# Patient Record
Sex: Female | Born: 1995 | Race: Black or African American | Hispanic: No | Marital: Single | Smoking: Former smoker
Health system: Southern US, Community
[De-identification: ages and names within clinical notes are randomized; demographics above are authoritative.]

## PROBLEM LIST (undated history)

## (undated) DIAGNOSIS — M419 Scoliosis, unspecified: Secondary | ICD-10-CM

## (undated) HISTORY — PX: BACK SURGERY: SHX140

---

## 2014-02-01 ENCOUNTER — Emergency Department (HOSPITAL_BASED_OUTPATIENT_CLINIC_OR_DEPARTMENT_OTHER)
Admission: EM | Admit: 2014-02-01 | Discharge: 2014-02-02 | Disposition: A | Discharge status: Home / Self Care | Payer: Self-pay | Attending: Emergency Medicine | Admitting: Emergency Medicine

## 2014-02-01 ENCOUNTER — Encounter (HOSPITAL_BASED_OUTPATIENT_CLINIC_OR_DEPARTMENT_OTHER): Payer: Self-pay | Admitting: *Deleted

## 2014-02-01 DIAGNOSIS — B3731 Acute candidiasis of vulva and vagina: Secondary | ICD-10-CM

## 2014-02-01 DIAGNOSIS — Z3202 Encounter for pregnancy test, result negative: Secondary | ICD-10-CM | POA: Insufficient documentation

## 2014-02-01 DIAGNOSIS — B373 Candidiasis of vulva and vagina: Secondary | ICD-10-CM | POA: Insufficient documentation

## 2014-02-01 DIAGNOSIS — N390 Urinary tract infection, site not specified: Secondary | ICD-10-CM | POA: Insufficient documentation

## 2014-02-01 DIAGNOSIS — Z720 Tobacco use: Secondary | ICD-10-CM | POA: Insufficient documentation

## 2014-02-01 HISTORY — DX: Scoliosis, unspecified: M41.9

## 2014-02-01 NOTE — ED Notes (Signed)
Pt c/o vaginal discharge x 2 weeks  

## 2014-02-02 LAB — URINALYSIS, ROUTINE W REFLEX MICROSCOPIC
Bilirubin Urine: NEGATIVE
Glucose, UA: NEGATIVE mg/dL
Ketones, ur: NEGATIVE mg/dL
NITRITE: NEGATIVE
PH: 5.5 (ref 5.0–8.0)
Protein, ur: 30 mg/dL — AB
Specific Gravity, Urine: 1.027 (ref 1.005–1.030)
Urobilinogen, UA: 0.2 mg/dL (ref 0.0–1.0)

## 2014-02-02 LAB — WET PREP, GENITAL
CLUE CELLS WET PREP: NONE SEEN
TRICH WET PREP: NONE SEEN

## 2014-02-02 LAB — PREGNANCY, URINE: Preg Test, Ur: NEGATIVE

## 2014-02-02 LAB — URINE MICROSCOPIC-ADD ON

## 2014-02-02 LAB — GC/CHLAMYDIA PROBE AMP (~~LOC~~) NOT AT ARMC
Chlamydia: POSITIVE — AB
Neisseria Gonorrhea: NEGATIVE

## 2014-02-02 MED ORDER — FLUCONAZOLE 50 MG PO TABS
150.0000 mg | ORAL_TABLET | Freq: Once | ORAL | Status: AC
  Administered 2014-02-02: 150 mg via ORAL

## 2014-02-02 MED ORDER — NITROFURANTOIN MONOHYD MACRO 100 MG PO CAPS
ORAL_CAPSULE | ORAL | Status: AC
  Filled 2014-02-02: qty 1

## 2014-02-02 MED ORDER — FLUCONAZOLE 100 MG PO TABS
ORAL_TABLET | ORAL | Status: AC
  Filled 2014-02-02: qty 1

## 2014-02-02 MED ORDER — NITROFURANTOIN MONOHYD MACRO 100 MG PO CAPS
100.0000 mg | ORAL_CAPSULE | Freq: Once | ORAL | Status: AC
  Administered 2014-02-02: 100 mg via ORAL

## 2014-02-02 MED ORDER — NITROFURANTOIN MONOHYD MACRO 100 MG PO CAPS: 100.0000 mg | ORAL_CAPSULE | Freq: Two times a day (BID) | ORAL | Status: DC

## 2014-02-02 NOTE — ED Provider Notes (Signed)
CSN: [id number]     Arrival date & time 02/01/14  2352 History   First MD Initiated Contact with Patient 02/02/14 0131     Chief Complaint  Patient presents with  . Vaginal Discharge     (Consider location/radiation/quality/duration/timing/severity/associated sxs/prior Treatment) HPI  This is a 19 year old female with a two-week history of burning with urination and pelvic discomfort with urination. Symptoms are moderate. The symptoms been associated with a "creamy" vaginal discharge. She also noted some mild vaginal bleeding this morning. She recently had a Depo shot and does not normally have bleeding with this. There is no specific exacerbating or mitigating factor. She denies fever, chills, nausea, vomiting or diarrhea.  Past Medical History  Diagnosis Date  . Scoliosis    Past Surgical History  Procedure Laterality Date  . Back surgery     History reviewed. No pertinent family history. History  Substance Use Topics  . Smoking status: Current Some Day Smoker  . Smokeless tobacco: Not on file  . Alcohol Use: No   OB History    No data available     Review of Systems  All other systems reviewed and are negative.   Allergies  Review of patient's allergies indicates no known allergies.  Home Medications   Prior to Admission medications   Not on File   BP 102/66 mmHg  Pulse 98  Temp(Src) 98.3 F (36.8 C) (Oral)  Resp 16  Ht 5\' 7"  (1.702 m)  Wt 118 lb (53.524 kg)  BMI 18.48 kg/m2  SpO2 100%   Physical Exam  General: Well-developed, well-nourished female in no acute distress; appearance consistent with age of record HENT: normocephalic; atraumatic Eyes: pupils equal, round and reactive to light; extraocular muscles intact Neck: supple Heart: regular rate and rhythm Lungs: clear to auscultation bilaterally Abdomen: soft; nondistended; nontender; no masses or hepatosplenomegaly; bowel sounds present GU: Normal external genitalia; gray vaginal discharge; no  cervical motion tenderness; no adnexal tenderness Extremities: No deformity; full range of motion; pulses normal Neurologic: Awake, alert and oriented; motor function intact in all extremities and symmetric; no facial droop Skin: Warm and dry Psychiatric: Normal mood and affect    ED Course  Procedures (including critical care time)   MDM   Nursing notes and vitals signs, including pulse oximetry, reviewed.  Summary of this visit's results, reviewed by myself:  Labs:  Results for orders placed or performed during the hospital encounter of 02/01/14 (from the past 24 hour(s))  Urinalysis, Routine w reflex microscopic     Status: Abnormal   Collection Time: 02/02/14 12:05 AM  Result Value Ref Range   Color, Urine YELLOW YELLOW   APPearance CLOUDY (A) CLEAR   Specific Gravity, Urine 1.027 1.005 - 1.030   pH 5.5 5.0 - 8.0   Glucose, UA NEGATIVE NEGATIVE mg/dL   Hgb urine dipstick LARGE (A) NEGATIVE   Bilirubin Urine NEGATIVE NEGATIVE   Ketones, ur NEGATIVE NEGATIVE mg/dL   Protein, ur 30 (A) NEGATIVE mg/dL   Urobilinogen, UA 0.2 0.0 - 1.0 mg/dL   Nitrite NEGATIVE NEGATIVE   Leukocytes, UA LARGE (A) NEGATIVE  Pregnancy, urine     Status: None   Collection Time: 02/02/14 12:05 AM  Result Value Ref Range   Preg Test, Ur NEGATIVE NEGATIVE  Urine microscopic-add on     Status: Abnormal   Collection Time: 02/02/14 12:05 AM  Result Value Ref Range   Squamous Epithelial / LPF FEW (A) RARE   WBC, UA 21-50 <3 WBC/hpf  RBC / HPF 11-20 <3 RBC/hpf   Bacteria, UA MANY (A) RARE   Urine-Other MUCOUS PRESENT   Wet prep, genital     Status: Abnormal   Collection Time: 02/02/14  1:43 AM  Result Value Ref Range   Yeast Wet Prep HPF POC MODERATE (A) NONE SEEN   Trich, Wet Prep NONE SEEN NONE SEEN   Clue Cells Wet Prep HPF POC NONE SEEN NONE SEEN   WBC, Wet Prep HPF POC MANY (A) NONE SEEN      Hanley Seamen, MD 02/02/14 [phone]

## 2014-02-04 LAB — URINE CULTURE: Colony Count: 85000

## 2014-02-06 ENCOUNTER — Telehealth (HOSPITAL_COMMUNITY): Payer: Self-pay

## 2014-02-06 NOTE — Telephone Encounter (Signed)
Post ED Visit - Positive Culture Follow-up  Culture report reviewed by antimicrobial stewardship pharmacist: []  Wes Dulaney, Pharm.D., BCPS []  Celedonio MiyamotoJeremy Frens, Pharm.D., BCPS [x]  Georgina PillionElizabeth Martin, Pharm.D., BCPS []  Piney PointMinh Pham, [address]Ph[address]D., BCPS, AAHIVP []  Estella HuskMichelle Turner, Pharm.D., BCPS, AAHIVP []  Elder CyphersLorie Poole, [address]Pharm[address]D., BCPS  Positive Urine culture, 85,000 colonies -> Klebsiella Pneumoniae  Treated with Nitrofurantoin, organism sensitive to the same and no further patient follow-up is required at this time.  Arvid RightClark, Daliah Chaudoin Dorn 02/06/2014, 11:09 PM

## 2014-04-07 ENCOUNTER — Encounter (HOSPITAL_BASED_OUTPATIENT_CLINIC_OR_DEPARTMENT_OTHER): Payer: Self-pay

## 2014-04-07 ENCOUNTER — Emergency Department (HOSPITAL_BASED_OUTPATIENT_CLINIC_OR_DEPARTMENT_OTHER)
Admission: EM | Admit: 2014-04-07 | Discharge: 2014-04-07 | Disposition: A | Discharge status: Home / Self Care | Payer: Self-pay | Attending: Emergency Medicine | Admitting: Emergency Medicine

## 2014-04-07 ENCOUNTER — Emergency Department (HOSPITAL_BASED_OUTPATIENT_CLINIC_OR_DEPARTMENT_OTHER): Payer: Self-pay

## 2014-04-07 DIAGNOSIS — M79674 Pain in right toe(s): Secondary | ICD-10-CM

## 2014-04-07 DIAGNOSIS — Y9389 Activity, other specified: Secondary | ICD-10-CM | POA: Insufficient documentation

## 2014-04-07 DIAGNOSIS — W228XXA Striking against or struck by other objects, initial encounter: Secondary | ICD-10-CM | POA: Insufficient documentation

## 2014-04-07 DIAGNOSIS — Y9289 Other specified places as the place of occurrence of the external cause: Secondary | ICD-10-CM | POA: Insufficient documentation

## 2014-04-07 DIAGNOSIS — Y998 Other external cause status: Secondary | ICD-10-CM | POA: Insufficient documentation

## 2014-04-07 DIAGNOSIS — Z720 Tobacco use: Secondary | ICD-10-CM | POA: Insufficient documentation

## 2014-04-07 DIAGNOSIS — Z8739 Personal history of other diseases of the musculoskeletal system and connective tissue: Secondary | ICD-10-CM | POA: Insufficient documentation

## 2014-04-07 DIAGNOSIS — S99921A Unspecified injury of right foot, initial encounter: Secondary | ICD-10-CM | POA: Insufficient documentation

## 2014-04-07 MED ORDER — CEPHALEXIN 500 MG PO CAPS: 500.0000 mg | ORAL_CAPSULE | Freq: Four times a day (QID) | ORAL | Status: DC

## 2014-04-07 MED ORDER — IBUPROFEN 600 MG PO TABS: 600.0000 mg | ORAL_TABLET | Freq: Four times a day (QID) | ORAL | Status: DC | PRN

## 2014-04-07 NOTE — ED Provider Notes (Signed)
CSN: [id number]     Arrival date & time 04/07/14  1116 History   First MD Initiated Contact with Patient 04/07/14 1156     Chief Complaint  Patient presents with  . Toe Injury     (Consider location/radiation/quality/duration/timing/severity/associated sxs/prior Treatment) HPI Elizabeth Riley is a 19 y.o. female who comes in for evaluation of right great toe pain. Patient states approximately 2 weeks ago she was opening a glass and metal door that scraped the inside of her right great toe and started to bleed. She has just kept it wrapped up but it has become increasingly uncomfortable over the past 2 weeks. Rates her discomfort as severe. She has not taken anything to improve her symptoms. Warm showers seemed to improve the discomfort and palpation and movement seem to worsen the pain. Denies any fevers, numbness or weakness, drainage from the wound. No other aggravating or modifying factors  Past Medical History  Diagnosis Date  . Scoliosis    Past Surgical History  Procedure Laterality Date  . Back surgery     No family history on file. History  Substance Use Topics  . Smoking status: Current Some Day Smoker  . Smokeless tobacco: Not on file  . Alcohol Use: No   OB History    No data available     Review of Systems  Constitutional: Negative for fever.  Respiratory: Negative for shortness of breath.   Cardiovascular: Negative for chest pain.  Musculoskeletal: Positive for arthralgias.       Toe pain  Skin: Negative for rash.  Neurological: Negative for weakness and numbness.      Allergies  Review of patient's allergies indicates no known allergies.  Home Medications   Prior to Admission medications   Medication Sig Start Date End Date Taking? Authorizing Provider  cephALEXin (KEFLEX) 500 MG capsule Take 1 capsule (500 mg total) by mouth 4 (four) times daily. 04/07/14   Joycie PeekBenjamin Lottie Sigman, PA-C  ibuprofen (ADVIL,MOTRIN) 600 MG tablet Take 1 tablet (600 mg total) by  mouth every 6 (six) hours as needed. 04/07/14   Broughton Eppinger, PA-C   BP 109/55 mmHg  Pulse 68  Temp(Src) 98.2 F (36.8 C) (Oral)  Resp 16  Ht 5\' 7"  (1.702 m)  Wt 121 lb (54.885 kg)  BMI 18.95 kg/m2  SpO2 100% Physical Exam  Constitutional:  Awake, alert, nontoxic appearance.  HENT:  Head: Atraumatic.  Eyes: Right eye exhibits no discharge. Left eye exhibits no discharge.  Neck: Neck supple.  Pulmonary/Chest: Effort normal. She exhibits no tenderness.  Abdominal: Soft. There is no tenderness. There is no rebound.  Musculoskeletal: She exhibits no tenderness.  Baseline ROM, no obvious new focal weakness. Tenderness to medial aspect of right great toe. Well-healing, linear lesion approximately 3 cm in length with mild surrounding redness. No overt erythema or warmth to MTP joints or interphalangeal joints. Distal pulses intact with brisk cap refill. Full passive range of motion of right great toe.  Neurological:  Mental status and motor strength appears baseline for patient and situation.  Skin: No rash noted.  Psychiatric: She has a normal mood and affect.  Nursing note and vitals reviewed.   ED Course  Procedures (including critical care time) Labs Review Labs Reviewed - No data to display  Imaging Review Dg Toe Great Right  04/07/2014   CLINICAL DATA:  Laceration of the medial right great toe from a door scraped 2 weeks ago. Redness and swelling.  EXAM: RIGHT GREAT TOE  COMPARISON:  None.  FINDINGS: There is no evidence of fracture or dislocation. There is no evidence of arthropathy or other focal bone abnormality. Soft tissues are unremarkable.  IMPRESSION: No acute abnormality of the right great toe.   Electronically Signed   By: Elige Ko   On: 04/07/2014 13:17     EKG Interpretation None     Meds given in ED:  Medications - No data to display  Discharge Medication List as of 04/07/2014  1:43 PM    START taking these medications   Details  cephALEXin  (KEFLEX) 500 MG capsule Take 1 capsule (500 mg total) by mouth 4 (four) times daily., Starting 04/07/2014, Until Discontinued, Print    ibuprofen (ADVIL,MOTRIN) 600 MG tablet Take 1 tablet (600 mg total) by mouth every 6 (six) hours as needed., Starting 04/07/2014, Until Discontinued, Print       Filed Vitals:   04/07/14 1122 04/07/14 1351  BP: 126/77 109/55  Pulse: 95 68  Temp: 98.7 F (37.1 C) 98.2 F (36.8 C)  TempSrc: Oral Oral  Resp: 18 16  Height:  (1.702 m)   Weight: 121 lb (54.885 kg)   SpO2: 100% 100%    MDM  Vitals stable - WNL -afebrile Pt resting comfortably in ED. PE--FROM of R Great toe. NVI, no overt erythema or edema to joint. Mild redness around healing lac. Imaging-No evidence of Frx, Dislocation or osteomyelitis.  DDX--Will DC with abx, anti inflammatories for pain and recommend re-eval by PCP. No evidence of septic arthritis, hemarthrosis or other vascular compromise.  I discussed all relevant lab findings and imaging results with pt and they verbalized understanding. Discussed f/u with PCP within 48 hrs and return precautions, pt very amenable to plan.  Final diagnoses:  Pain of great toe, right        Joycie Peek, PA-C 04/08/14 1433  Jerelyn Scott, MD 04/08/14 1504

## 2014-04-07 NOTE — ED Notes (Signed)
Injured right great toe approx 2 weeks ago-c/o increase pain, redness and decreased movement

## 2014-04-07 NOTE — Discharge Instructions (Signed)
You were evaluated in the ED today for your right toe pain. Your x-ray showed no evidence of broken bones or dislocations. Please take your medications as prescribed and follow-up with her primary care for further evaluation and management of your symptoms. Return to ED for new or worsening symptoms.

## 2015-08-01 IMAGING — CR DG TOE GREAT 2+V*R*
3 series · 3 of 3 positions shown · non-contrast
Comparison: None.

CLINICAL DATA: Laceration of the medial right great toe from a door
scraped 2 weeks ago. Redness and swelling.

EXAM:
RIGHT GREAT TOE

[t toes ap right]
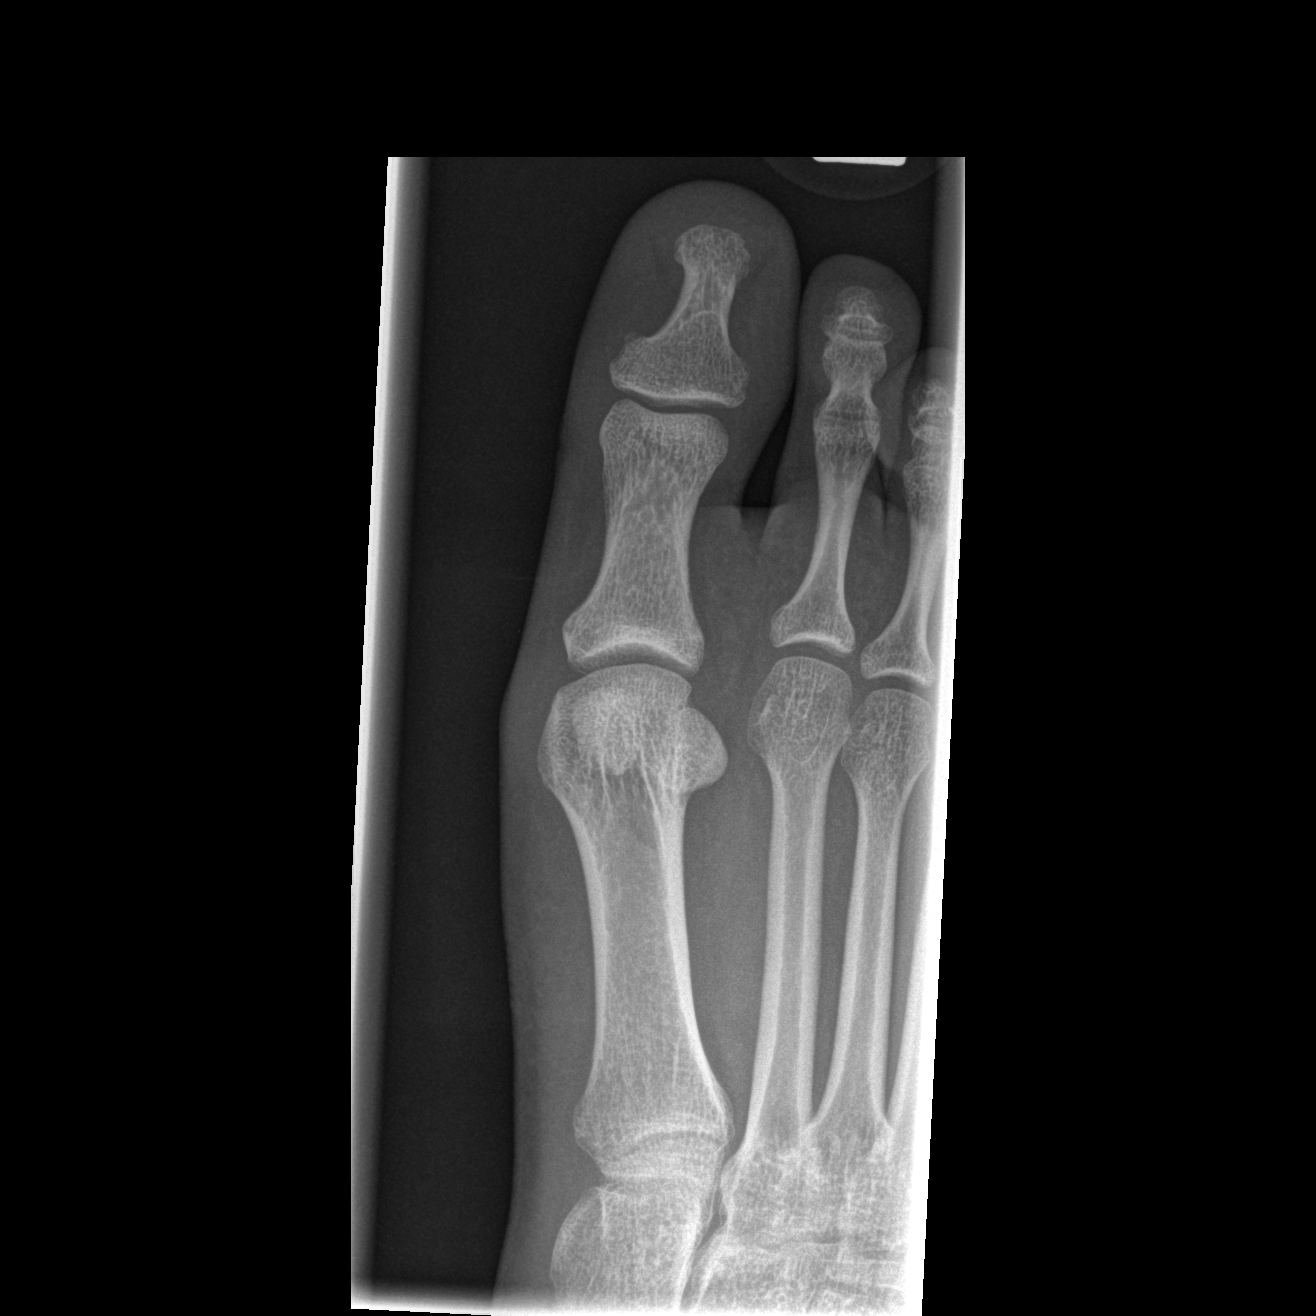

[t toes oblique right]
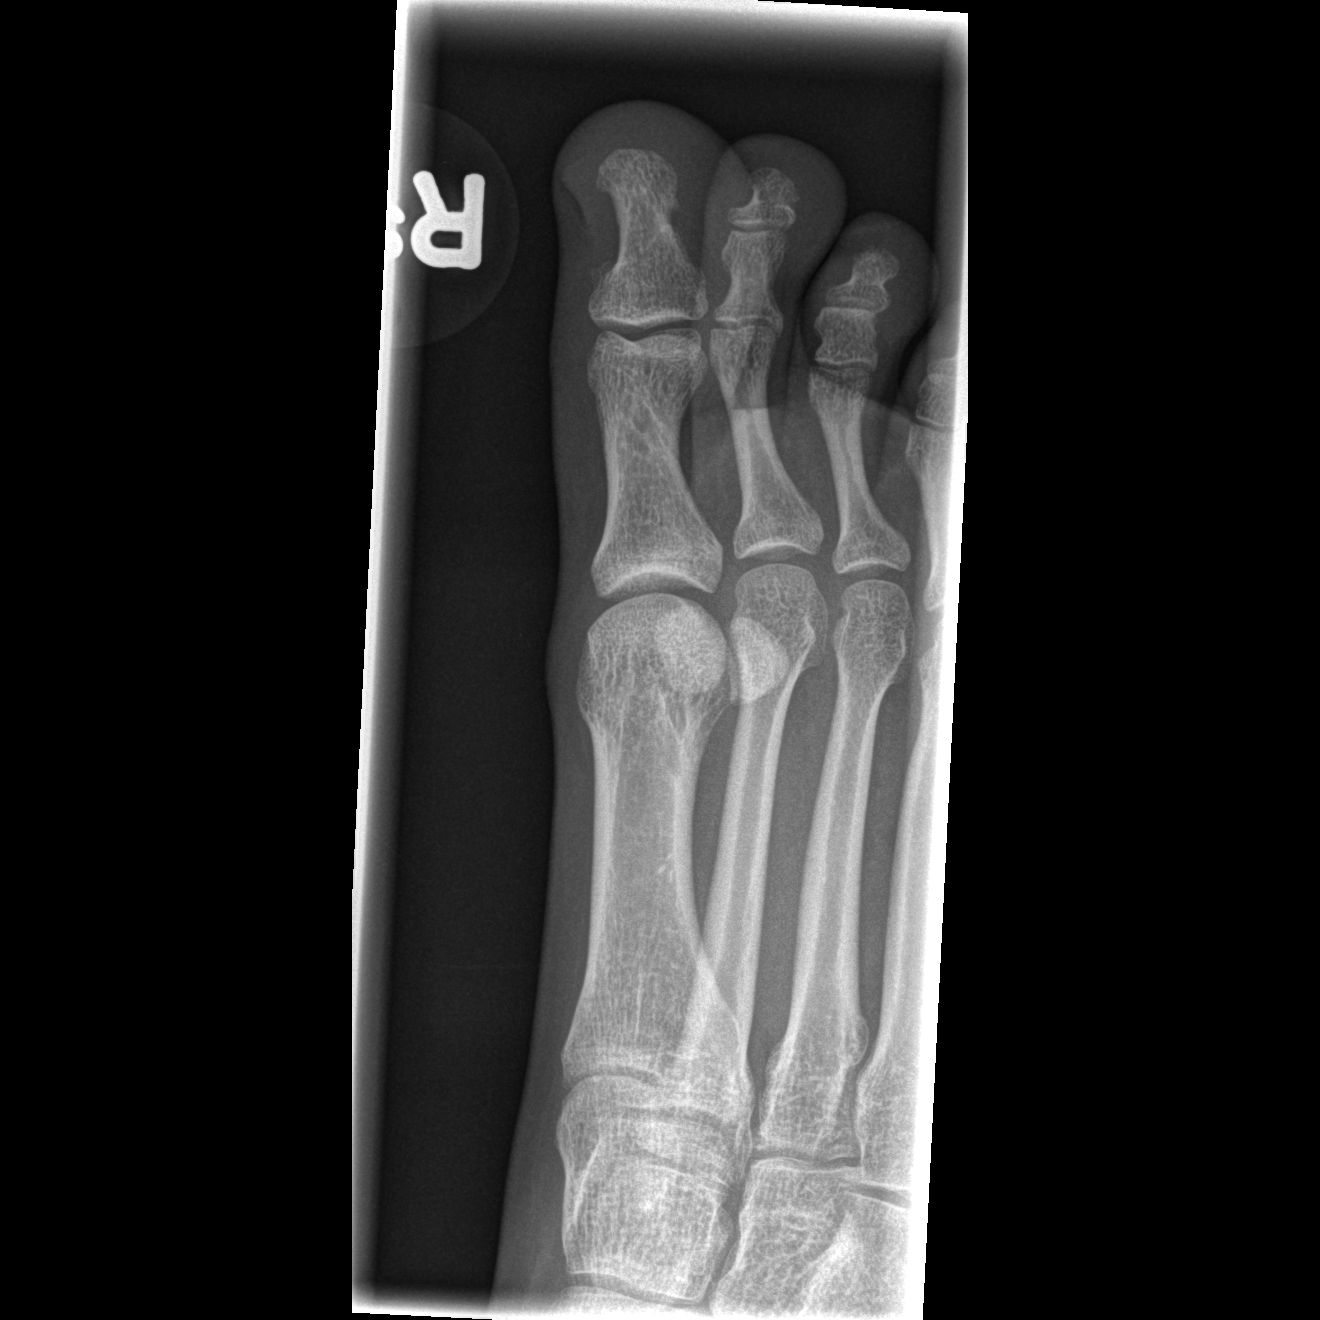

[t toes lateral right]
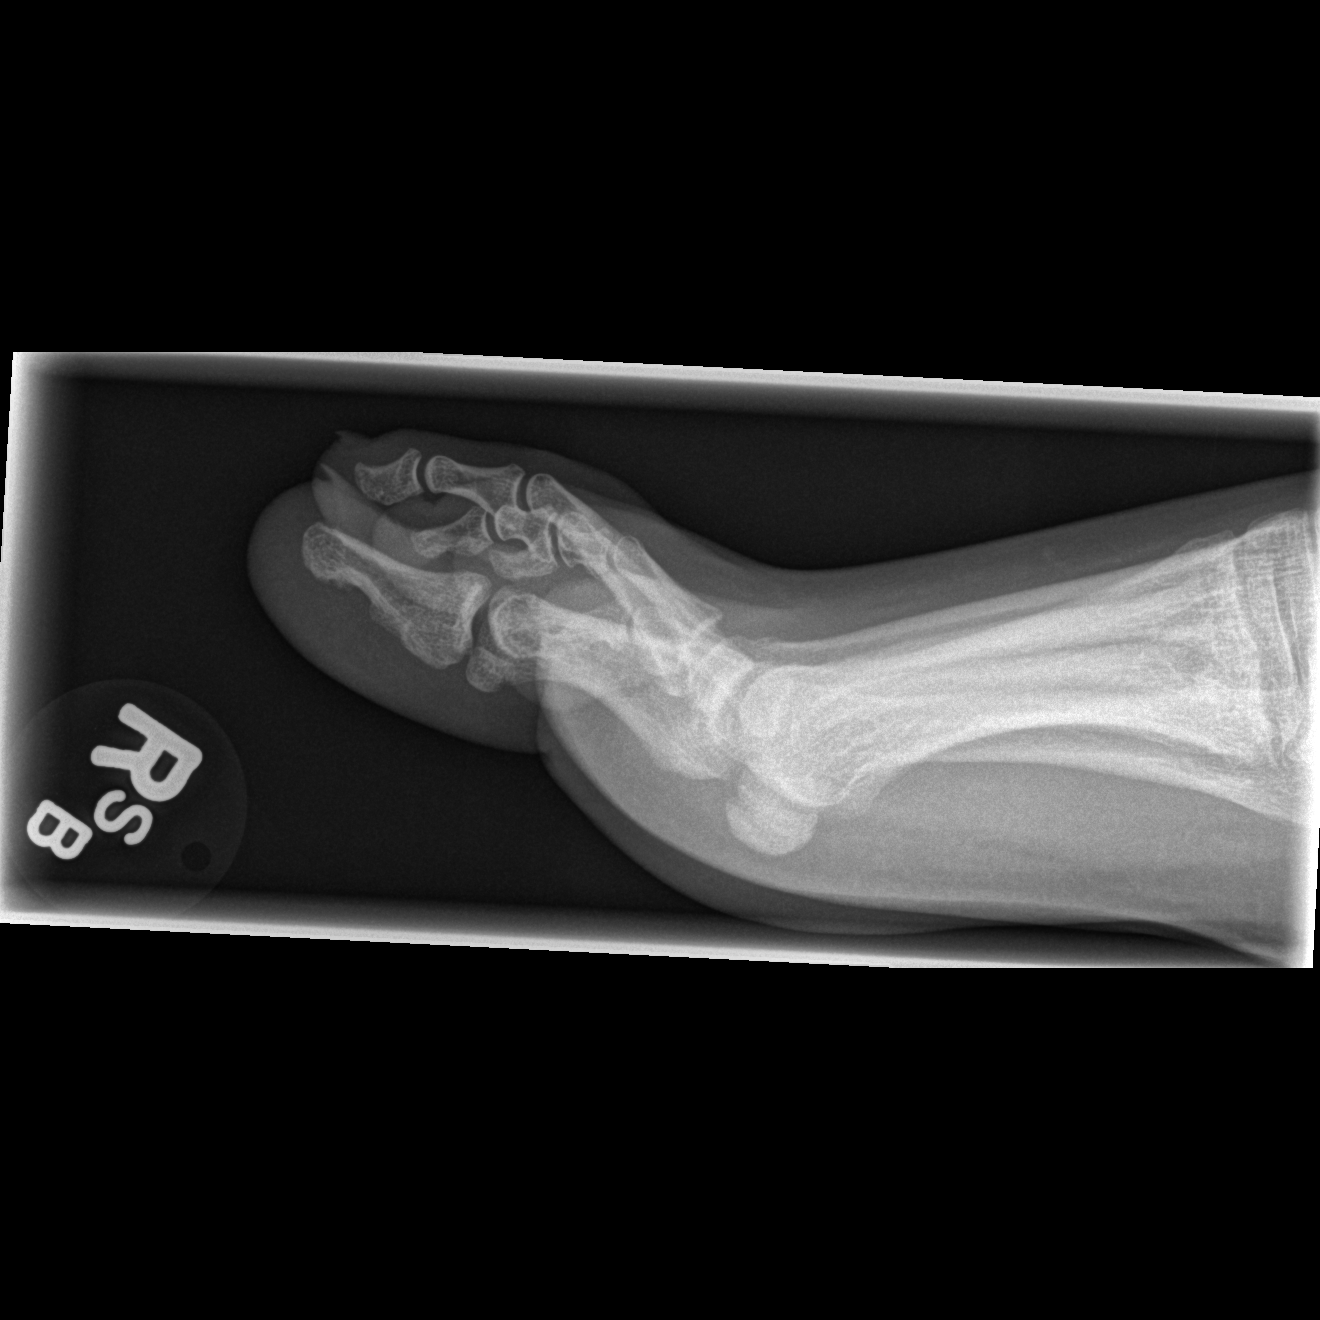

[3 of 3 positions shown; findings below may reference images not displayed]

FINDINGS: There is no evidence of fracture or dislocation. There is no
evidence of arthropathy or other focal bone abnormality. Soft
tissues are unremarkable.
IMPRESSION: No acute abnormality of the right great toe.

## 2016-02-28 ENCOUNTER — Encounter (HOSPITAL_BASED_OUTPATIENT_CLINIC_OR_DEPARTMENT_OTHER): Payer: Self-pay

## 2016-02-28 DIAGNOSIS — Z3A01 Less than 8 weeks gestation of pregnancy: Secondary | ICD-10-CM | POA: Insufficient documentation

## 2016-02-28 DIAGNOSIS — Z87891 Personal history of nicotine dependence: Secondary | ICD-10-CM | POA: Insufficient documentation

## 2016-02-28 DIAGNOSIS — N898 Other specified noninflammatory disorders of vagina: Secondary | ICD-10-CM | POA: Insufficient documentation

## 2016-02-28 DIAGNOSIS — O26891 Other specified pregnancy related conditions, first trimester: Secondary | ICD-10-CM | POA: Insufficient documentation

## 2016-02-28 DIAGNOSIS — R1030 Lower abdominal pain, unspecified: Secondary | ICD-10-CM | POA: Insufficient documentation

## 2016-02-28 LAB — URINALYSIS, ROUTINE W REFLEX MICROSCOPIC
Bilirubin Urine: NEGATIVE
Glucose, UA: NEGATIVE mg/dL
HGB URINE DIPSTICK: NEGATIVE
KETONES UR: NEGATIVE mg/dL
Nitrite: NEGATIVE
PROTEIN: NEGATIVE mg/dL
SPECIFIC GRAVITY, URINE: 1.023 (ref 1.005–1.030)
pH: 7 (ref 5.0–8.0)

## 2016-02-28 LAB — URINALYSIS, MICROSCOPIC (REFLEX): RBC / HPF: NONE SEEN RBC/hpf (ref 0–5)

## 2016-02-28 LAB — PREGNANCY, URINE: Preg Test, Ur: POSITIVE — AB

## 2016-02-28 NOTE — ED Triage Notes (Signed)
C/o abd pain x 1 week-pos vaginal d/c-denies n/v/d-NAD-steady gait

## 2016-02-29 ENCOUNTER — Other Ambulatory Visit (HOSPITAL_BASED_OUTPATIENT_CLINIC_OR_DEPARTMENT_OTHER): Payer: Self-pay | Admitting: Emergency Medicine

## 2016-02-29 ENCOUNTER — Emergency Department (HOSPITAL_BASED_OUTPATIENT_CLINIC_OR_DEPARTMENT_OTHER)
Admission: EM | Admit: 2016-02-29 | Discharge: 2016-02-29 | Disposition: A | Discharge status: Home / Self Care | Payer: Self-pay | Attending: Emergency Medicine | Admitting: Emergency Medicine

## 2016-02-29 ENCOUNTER — Ambulatory Visit (HOSPITAL_BASED_OUTPATIENT_CLINIC_OR_DEPARTMENT_OTHER)
Admission: RE | Admit: 2016-02-29 | Discharge: 2016-02-29 | Disposition: A | Discharge status: Home / Self Care | Payer: Self-pay | Source: Ambulatory Visit | Attending: Emergency Medicine | Admitting: Emergency Medicine

## 2016-02-29 DIAGNOSIS — Z3490 Encounter for supervision of normal pregnancy, unspecified, unspecified trimester: Secondary | ICD-10-CM

## 2016-02-29 DIAGNOSIS — Z3A00 Weeks of gestation of pregnancy not specified: Secondary | ICD-10-CM | POA: Insufficient documentation

## 2016-02-29 DIAGNOSIS — R109 Unspecified abdominal pain: Secondary | ICD-10-CM

## 2016-02-29 DIAGNOSIS — N83291 Other ovarian cyst, right side: Secondary | ICD-10-CM | POA: Insufficient documentation

## 2016-02-29 DIAGNOSIS — R102 Pelvic and perineal pain: Principal | ICD-10-CM

## 2016-02-29 DIAGNOSIS — O26891 Other specified pregnancy related conditions, first trimester: Secondary | ICD-10-CM

## 2016-02-29 DIAGNOSIS — O26899 Other specified pregnancy related conditions, unspecified trimester: Secondary | ICD-10-CM | POA: Insufficient documentation

## 2016-02-29 LAB — WET PREP, GENITAL
Sperm: NONE SEEN
TRICH WET PREP: NONE SEEN
YEAST WET PREP: NONE SEEN

## 2016-02-29 LAB — HCG, QUANTITATIVE, PREGNANCY: hCG, Beta Chain, Quant, S: 1531 m[IU]/mL — ABNORMAL HIGH (ref <5)

## 2016-02-29 LAB — ABO/RH: ABO/RH(D): A POS

## 2016-02-29 LAB — GC/CHLAMYDIA PROBE AMP (~~LOC~~) NOT AT ARMC
Chlamydia: NEGATIVE
Neisseria Gonorrhea: NEGATIVE

## 2016-02-29 MED ORDER — PRENATAL VITAMIN 27-0.8 MG PO TABS: 1.0000 | ORAL_TABLET | Freq: Every day | ORAL | 2 refills | Status: AC

## 2016-02-29 NOTE — ED Notes (Signed)
C/o lower abd pain x 1 week,  Denies n/v/d,  Denies abnormal dc

## 2016-02-29 NOTE — ED Provider Notes (Signed)
Patient presents from radiology after having an ultrasound today. She was seen last night with a positive pregnancy test and an hCG of 1531. She's been having intermittent abdominal pain but denies any ongoing pain and currently is not having pain. Ultrasound shows early IUP and a right hemorrhagic cyst on the ovary. Again patient has no pain at this time. Low suspicion for an ectopic pregnancy. Gave her strict [facility] and ongoing care. Discussed with her if she develops sudden acute pain or heavy bleeding to go sooner.   Gwyneth SproutWhitney Shell Yandow, MD 02/29/16 [phone]

## 2016-02-29 NOTE — ED Provider Notes (Signed)
[facility] Provider Note: [name], MD, FACEP  CSN: [id number] MRN: [id number] ARRIVAL: 02/28/16 at 2129 ROOM: [facility]07/[facility]07   CHIEF COMPLAINT  Abdominal Pain   HISTORY OF PRESENT ILLNESS  Elizabeth Riley is a 21 y.o. female with a one-week history of pelvic cramping. She describes the cramping is moderate and like menstrual cramps. She does not usually get cramps with her periods. Her last menstrual period was January 21 of this year. She is having a vaginal discharge but no vaginal bleeding. She denies fever, chills, nausea, vomiting, diarrhea or dysuria. She has not taken anything for her cramping.   Past Medical History:  Diagnosis Date  . Scoliosis     Past Surgical History:  Procedure Laterality Date  . BACK SURGERY      No family history on file.  Social History  Substance Use Topics  . Smoking status: Former Games developer  . Smokeless tobacco: Never Used  . Alcohol use No    Prior to Admission medications   Medication Sig Start Date End Date Taking? Authorizing Provider  Prenatal Vit-Fe Fumarate-FA (PRENATAL VITAMIN) 27-0.8 MG TABS Take 1 tablet by mouth daily. 02/29/16   Paula Libra, MD    Allergies Patient has no known allergies.   REVIEW OF SYSTEMS  Negative except as noted here or in the History of Present Illness.   PHYSICAL EXAMINATION  Initial Vital Signs Blood pressure 116/63, pulse 74, temperature 98.8 F (37.1 C), temperature source Oral, resp. rate 16, height 5\' 7"  (1.702 m), weight 114 lb (51.7 kg), last menstrual period 01/28/2016, SpO2 98 %.  Examination General: Well-developed, well-nourished female in no acute distress; appearance consistent with age of record HENT: normocephalic; atraumatic Eyes: pupils equal, round and reactive to light; extraocular muscles intact Neck: supple Heart: regular rate and rhythm Lungs: clear to auscultation bilaterally Abdomen: soft; nondistended; mild suprapubic tenderness; no masses or  hepatosplenomegaly; bowel sounds present GU: No CVA tenderness; normal external genitalia; yellowish vaginal discharge; no vaginal bleeding; no cervical motion tenderness; no adnexal tenderness; mild uterine tenderness Extremities: No deformity; full range of motion; pulses normal Neurologic: Awake, alert and oriented; motor function intact in all extremities and symmetric; no facial droop Skin: Warm and dry Psychiatric: Normal mood and affect   RESULTS  Summary of this visit's results, reviewed by myself:   EKG Interpretation  Date/Time:    Ventricular Rate:    PR Interval:    QRS Duration:   QT Interval:    QTC Calculation:   R Axis:     Text Interpretation:        Laboratory Studies: Results for orders placed or performed during the hospital encounter of 02/29/16 (from the past 24 hour(s))  Pregnancy, urine     Status: Abnormal   Collection Time: 02/28/16  9:45 PM  Result Value Ref Range   Preg Test, Ur POSITIVE (A) NEGATIVE  Urinalysis, Routine w reflex microscopic     Status: Abnormal   Collection Time: 02/28/16  9:45 PM  Result Value Ref Range   Color, Urine YELLOW YELLOW   APPearance CLEAR CLEAR   Specific Gravity, Urine 1.023 1.005 - 1.030   pH 7.0 5.0 - 8.0   Glucose, UA NEGATIVE NEGATIVE mg/dL   Hgb urine dipstick NEGATIVE NEGATIVE   Bilirubin Urine NEGATIVE NEGATIVE   Ketones, ur NEGATIVE NEGATIVE mg/dL   Protein, ur NEGATIVE NEGATIVE mg/dL   Nitrite NEGATIVE NEGATIVE   Leukocytes, UA TRACE (A) NEGATIVE  Urinalysis, Microscopic (reflex)  Status: Abnormal   Collection Time: 02/28/16  9:45 PM  Result Value Ref Range   RBC / HPF NONE SEEN 0 - 5 RBC/hpf   WBC, UA 0-5 0 - 5 WBC/hpf   Bacteria, UA RARE (A) NONE SEEN   Squamous Epithelial / LPF 0-5 (A) NONE SEEN  Wet prep, genital     Status: Abnormal   Collection Time: 02/29/16  1:11 AM  Result Value Ref Range   Yeast Wet Prep HPF POC NONE SEEN NONE SEEN   Trich, Wet Prep NONE SEEN NONE SEEN   Clue  Cells Wet Prep HPF POC PRESENT (A) NONE SEEN   WBC, Wet Prep HPF POC MANY (A) NONE SEEN   Sperm NONE SEEN    Imaging Studies: No results found.  ED COURSE  Nursing notes and initial vitals signs, including pulse oximetry, reviewed.  Vitals:   02/28/16 2139 02/29/16 0023  BP: 114/70 116/63  Pulse: 79 74  Resp: 16 16  Temp: 98.8 F (37.1 C)   TempSrc: Oral   SpO2: 100% 98%  Weight: 114 lb (51.7 kg)   Height: 5\' 7"  (1.702 m)    We'll have patient return later today for pelvic ultrasound.  PROCEDURES    ED DIAGNOSES     ICD-9-CM ICD-10-CM   1. Abdominal pain during pregnancy in first trimester 646.83 O26.891    789.00 R10.9        Paula LibraJohn Felix Pratt, MD 02/29/16 [phone]

## 2016-03-01 LAB — HIV ANTIBODY (ROUTINE TESTING W REFLEX): HIV Screen 4th Generation wRfx: NONREACTIVE

## 2016-04-23 ENCOUNTER — Encounter: Payer: Self-pay | Admitting: Family Medicine

## 2016-04-23 ENCOUNTER — Encounter: Payer: Self-pay | Admitting: Advanced Practice Midwife

## 2017-02-07 ENCOUNTER — Emergency Department (HOSPITAL_BASED_OUTPATIENT_CLINIC_OR_DEPARTMENT_OTHER)
Admission: EM | Admit: 2017-02-07 | Discharge: 2017-02-07 | Disposition: A | Discharge status: Home / Self Care | Payer: Medicaid Other | Attending: Emergency Medicine | Admitting: Emergency Medicine

## 2017-02-07 ENCOUNTER — Other Ambulatory Visit: Payer: Self-pay

## 2017-02-07 ENCOUNTER — Encounter (HOSPITAL_BASED_OUTPATIENT_CLINIC_OR_DEPARTMENT_OTHER): Payer: Self-pay | Admitting: Emergency Medicine

## 2017-02-07 DIAGNOSIS — Z79899 Other long term (current) drug therapy: Secondary | ICD-10-CM | POA: Diagnosis not present

## 2017-02-07 DIAGNOSIS — L299 Pruritus, unspecified: Secondary | ICD-10-CM | POA: Insufficient documentation

## 2017-02-07 DIAGNOSIS — R300 Dysuria: Secondary | ICD-10-CM | POA: Diagnosis present

## 2017-02-07 DIAGNOSIS — R350 Frequency of micturition: Secondary | ICD-10-CM | POA: Insufficient documentation

## 2017-02-07 DIAGNOSIS — Z87891 Personal history of nicotine dependence: Secondary | ICD-10-CM | POA: Diagnosis not present

## 2017-02-07 LAB — URINALYSIS, ROUTINE W REFLEX MICROSCOPIC
BILIRUBIN URINE: NEGATIVE
GLUCOSE, UA: NEGATIVE mg/dL
Hgb urine dipstick: NEGATIVE
Ketones, ur: 15 mg/dL — AB
NITRITE: NEGATIVE
Protein, ur: NEGATIVE mg/dL
SPECIFIC GRAVITY, URINE: 1.02 (ref 1.005–1.030)
pH: 7 (ref 5.0–8.0)

## 2017-02-07 LAB — PREGNANCY, URINE: Preg Test, Ur: NEGATIVE

## 2017-02-07 LAB — URINALYSIS, MICROSCOPIC (REFLEX): RBC / HPF: NONE SEEN RBC/hpf (ref 0–5)

## 2017-02-07 MED ORDER — CEPHALEXIN 500 MG PO CAPS: 500.0000 mg | ORAL_CAPSULE | Freq: Two times a day (BID) | ORAL | 0 refills | Status: AC

## 2017-02-07 NOTE — ED Notes (Signed)
Alert, NAD, calm, interactive, resps e/u, speaking in clear complete sentences, no dyspnea noted, skin W&D, VSS, c/o urethral pain "only with urination", also frequency, intermittent urgency, and some itching, (denies: abd or back pain, other pain, fever, NVD, bleeding, vaginal d/c, hematuria, other bleeding, or dizziness). No recent UTIs, no recent ABT. EDPA into room.

## 2017-02-07 NOTE — ED Provider Notes (Signed)
[facility] EMERGENCY DEPARTMENT Provider Note   CSN: [id number] Arrival date & time: 02/07/17  1933     History   Chief Complaint Chief Complaint  Patient presents with  . Dysuria    HPI Elizabeth Riley is a 22 y.o. female.  HPI   Elizabeth Riley is a 22 y.o. female, patient with no pertinent past medical history, presenting to the ED with burning after urination for two weeks.  Accompanied by urinary frequency.  States this feels like a UTI for her.  Denies fever/chills, abdominal pain, abnormal vaginal discharge or bleeding, nausea/vomiting, back pain, or any other complaints.   Past Medical History:  Diagnosis Date  . Scoliosis     There are no active problems to display for this patient.   Past Surgical History:  Procedure Laterality Date  . BACK SURGERY      OB History    No data available       Home Medications    Prior to Admission medications   Medication Sig Start Date End Date Taking? Authorizing Provider  cephALEXin (KEFLEX) 500 MG capsule Take 1 capsule (500 mg total) by mouth 2 (two) times daily for 5 days. 02/07/17 02/12/17  Wakisha Alberts, Hillard DankerShawn C, PA-C  Prenatal Vit-Fe Fumarate-FA (PRENATAL VITAMIN) 27-0.8 MG TABS Take 1 tablet by mouth daily. 02/29/16   Molpus, John, MD    Family History No family history on file.  Social History Social History   Tobacco Use  . Smoking status: Former Games developermoker  . Smokeless tobacco: Never Used  Substance Use Topics  . Alcohol use: No  . Drug use: No     Allergies   Patient has no known allergies.   Review of Systems Review of Systems  Constitutional: Negative for chills and fever.  Gastrointestinal: Negative for abdominal pain, diarrhea, nausea and vomiting.  Genitourinary: Positive for dysuria and frequency. Negative for flank pain, vaginal bleeding and vaginal discharge.  Musculoskeletal: Negative for back pain.  All other systems reviewed and are negative.    Physical Exam Updated Vital  Signs BP 121/83   Pulse 77   Temp 98.3 F (36.8 C)   Resp 16   Ht 5' 7.5" (1.715 m)   Wt 54.7 kg (120 lb 9.5 oz)   LMP 01/20/2017   SpO2 100%   BMI 18.61 kg/m   Physical Exam  Constitutional: She appears well-developed and well-nourished. No distress.  HENT:  Head: Normocephalic and atraumatic.  Eyes: Conjunctivae are normal.  Neck: Neck supple.  Cardiovascular: Normal rate, regular rhythm, normal heart sounds and intact distal pulses.  Pulmonary/Chest: Effort normal and breath sounds normal. No respiratory distress.  Abdominal: Soft. There is no tenderness. There is no guarding and no CVA tenderness.  Musculoskeletal: She exhibits no edema.  Lymphadenopathy:    She has no cervical adenopathy.  Neurological: She is alert.  Skin: Skin is warm and dry. She is not diaphoretic.  Psychiatric: She has a normal mood and affect. Her behavior is normal.  Nursing note and vitals reviewed.    ED Treatments / Results  Labs (all labs ordered are listed, but only abnormal results are displayed) Labs Reviewed  URINALYSIS, ROUTINE W REFLEX MICROSCOPIC - Abnormal; Notable for the following components:      Result Value   APPearance CLOUDY (*)    Ketones, ur 15 (*)    Leukocytes, UA SMALL (*)    All other components within normal limits  URINALYSIS, MICROSCOPIC (REFLEX) - Abnormal; Notable for the  following components:   Bacteria, UA FEW (*)    Squamous Epithelial / LPF 6-30 (*)    All other components within normal limits  URINE CULTURE  PREGNANCY, URINE    EKG  EKG Interpretation None       Radiology No results found.  Procedures Procedures (including critical care time)  Medications Ordered in ED Medications - No data to display   Initial Impression / Assessment and Plan / ED Course  I have reviewed the triage vital signs and the nursing notes.  Pertinent labs & imaging results that were available during my care of the patient were reviewed by me and considered  in my medical decision making (see chart for details).     Patient presents with dysuria without fever, flank pain, back pain, abdominal pain, or abnormal vaginal discharge.  States this is consistent with a UTI for her and per current recommendations, we will treat the patient with antibiotics.  States she prefers to follow-up with OB/GYN should any further exam be necessary, such as a pelvic exam. The patient was given instructions for home care as well as return precautions. Patient voices understanding of these instructions, accepts the plan, and is comfortable with discharge.    Final Clinical Impressions(s) / ED Diagnoses   Final diagnoses:  Dysuria    ED Discharge Orders        Ordered    cephALEXin (KEFLEX) 500 MG capsule  2 times daily     02/07/17 2104       Concepcion Living 02/08/17 1943    Gwyneth Sprout, MD 02/09/17 2149

## 2017-02-07 NOTE — ED Triage Notes (Signed)
Pt c/o dysuria x 2 wks

## 2017-02-07 NOTE — Discharge Instructions (Signed)
Please take all of your antibiotics until finished!   You may develop abdominal discomfort or diarrhea from the antibiotic.  You may help offset this with probiotics which you can buy or get in yogurt. Do not eat or take the probiotics until 2 hours after your antibiotic.   Should symptoms fail to resolve, follow-up with OB/GYN

## 2017-02-09 LAB — URINE CULTURE: Culture: <10000 — AB

## 2017-10-21 ENCOUNTER — Encounter: Payer: Self-pay | Admitting: *Deleted

## 2018-06-17 ENCOUNTER — Encounter: Payer: Self-pay | Admitting: *Deleted
